# Patient Record
Sex: Female | Born: 1985 | Race: White | Hispanic: No | Marital: Single | Smoking: Current every day smoker
Health system: Southern US, Community
[De-identification: ages and names within clinical notes are randomized; demographics above are authoritative.]

## PROBLEM LIST (undated history)

## (undated) ENCOUNTER — Inpatient Hospital Stay (HOSPITAL_COMMUNITY): Payer: Self-pay

## (undated) DIAGNOSIS — F410 Panic disorder [episodic paroxysmal anxiety] without agoraphobia: Secondary | ICD-10-CM

## (undated) DIAGNOSIS — K649 Unspecified hemorrhoids: Secondary | ICD-10-CM

## (undated) DIAGNOSIS — J45909 Unspecified asthma, uncomplicated: Secondary | ICD-10-CM

## (undated) DIAGNOSIS — F419 Anxiety disorder, unspecified: Secondary | ICD-10-CM

## (undated) DIAGNOSIS — I10 Essential (primary) hypertension: Secondary | ICD-10-CM

## (undated) DIAGNOSIS — E785 Hyperlipidemia, unspecified: Secondary | ICD-10-CM

## (undated) DIAGNOSIS — R4020 Unspecified coma: Secondary | ICD-10-CM

## (undated) DIAGNOSIS — J40 Bronchitis, not specified as acute or chronic: Secondary | ICD-10-CM

## (undated) DIAGNOSIS — G4700 Insomnia, unspecified: Secondary | ICD-10-CM

## (undated) DIAGNOSIS — F909 Attention-deficit hyperactivity disorder, unspecified type: Secondary | ICD-10-CM

## (undated) DIAGNOSIS — G43909 Migraine, unspecified, not intractable, without status migrainosus: Secondary | ICD-10-CM

## (undated) HISTORY — DX: Bronchitis, not specified as acute or chronic: J40

## (undated) HISTORY — PX: HEMORRHOID SURGERY: SHX153

## (undated) HISTORY — DX: Unspecified coma: R40.20

## (undated) HISTORY — DX: Anxiety disorder, unspecified: F41.9

## (undated) HISTORY — DX: Unspecified hemorrhoids: K64.9

## (undated) HISTORY — DX: Unspecified asthma, uncomplicated: J45.909

## (undated) HISTORY — DX: Essential (primary) hypertension: I10

## (undated) HISTORY — DX: Insomnia, unspecified: G47.00

## (undated) HISTORY — DX: Attention-deficit hyperactivity disorder, unspecified type: F90.9

## (undated) HISTORY — DX: Hyperlipidemia, unspecified: E78.5

## (undated) HISTORY — DX: Panic disorder (episodic paroxysmal anxiety): F41.0

## (undated) HISTORY — DX: Migraine, unspecified, not intractable, without status migrainosus: G43.909

## (undated) HISTORY — PX: MOLE REMOVAL: SHX2046

---
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
## 2006-11-17 ENCOUNTER — Inpatient Hospital Stay (HOSPITAL_COMMUNITY): Admission: AD | Admit: 2006-11-17 | Discharge: 2006-11-21 | Payer: Self-pay | Admitting: Psychiatry

## 2006-11-17 ENCOUNTER — Ambulatory Visit: Payer: Self-pay | Admitting: Psychiatry

## 2007-02-21 ENCOUNTER — Emergency Department (HOSPITAL_COMMUNITY): Admission: EM | Admit: 2007-02-21 | Discharge: 2007-02-21 | Payer: Self-pay | Admitting: Emergency Medicine

## 2007-02-23 ENCOUNTER — Emergency Department (HOSPITAL_COMMUNITY): Admission: EM | Admit: 2007-02-23 | Discharge: 2007-02-23 | Payer: Self-pay | Admitting: Emergency Medicine

## 2007-02-26 ENCOUNTER — Emergency Department (HOSPITAL_COMMUNITY): Admission: EM | Admit: 2007-02-26 | Discharge: 2007-02-26 | Payer: Self-pay | Admitting: Emergency Medicine

## 2007-04-11 ENCOUNTER — Ambulatory Visit: Payer: Self-pay | Admitting: Family Medicine

## 2007-04-14 ENCOUNTER — Ambulatory Visit: Payer: Self-pay | Admitting: *Deleted

## 2007-07-04 ENCOUNTER — Ambulatory Visit: Payer: Self-pay | Admitting: Family Medicine

## 2007-08-04 ENCOUNTER — Ambulatory Visit: Payer: Self-pay | Admitting: Family Medicine

## 2007-08-19 ENCOUNTER — Ambulatory Visit: Payer: Self-pay | Admitting: Family Medicine

## 2007-08-19 ENCOUNTER — Encounter (INDEPENDENT_AMBULATORY_CARE_PROVIDER_SITE_OTHER): Payer: Self-pay | Admitting: Family Medicine

## 2007-08-19 LAB — CONVERTED CEMR LAB
Chlamydia, DNA Probe: POSITIVE — AB
GC Probe Amp, Genital: NEGATIVE

## 2007-12-25 ENCOUNTER — Ambulatory Visit: Payer: Self-pay | Admitting: Family Medicine

## 2008-02-17 ENCOUNTER — Ambulatory Visit: Payer: Self-pay | Admitting: Internal Medicine

## 2008-04-12 ENCOUNTER — Emergency Department (HOSPITAL_COMMUNITY): Admission: EM | Admit: 2008-04-12 | Discharge: 2008-04-12 | Payer: Self-pay | Admitting: Emergency Medicine
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
## 2011-07-26 LAB — URINALYSIS, ROUTINE W REFLEX MICROSCOPIC
Nitrite: NEGATIVE
Protein, ur: 30 — AB
Specific Gravity, Urine: 1.026
Urobilinogen, UA: 1

## 2011-07-26 LAB — POCT PREGNANCY, URINE
Operator id: 264031
Preg Test, Ur: NEGATIVE

## 2011-07-26 LAB — WET PREP, GENITAL
Trich, Wet Prep: NONE SEEN
Yeast Wet Prep HPF POC: NONE SEEN

## 2011-07-26 LAB — URINE MICROSCOPIC-ADD ON

## 2011-07-26 LAB — GC/CHLAMYDIA PROBE AMP, GENITAL
Chlamydia, DNA Probe: NEGATIVE
GC Probe Amp, Genital: NEGATIVE

## 2013-03-13 ENCOUNTER — Ambulatory Visit (INDEPENDENT_AMBULATORY_CARE_PROVIDER_SITE_OTHER): Payer: Medicaid Other | Admitting: Gastroenterology

## 2013-03-13 ENCOUNTER — Encounter (INDEPENDENT_AMBULATORY_CARE_PROVIDER_SITE_OTHER): Payer: Self-pay | Admitting: Surgery

## 2013-03-13 ENCOUNTER — Encounter: Payer: Self-pay | Admitting: Gastroenterology

## 2013-03-13 VITALS — BP 110/62 | HR 76 | Ht 62.6 in | Wt 118.4 lb

## 2013-03-13 DIAGNOSIS — K6289 Other specified diseases of anus and rectum: Secondary | ICD-10-CM

## 2013-03-13 DIAGNOSIS — K645 Perianal venous thrombosis: Secondary | ICD-10-CM

## 2013-03-13 MED ORDER — LIDOCAINE 5 % EX OINT: TOPICAL_OINTMENT | CUTANEOUS | Status: DC | PRN

## 2013-03-13 NOTE — Patient Instructions (Addendum)
You have been given a separate informational sheet regarding your tobacco use, the importance of quitting and local resources to help you quit.  You have been scheduled for an appointment at [facility]. Your appointment is on 03-13-13 at 3:30 pm. Please arrive at 3:15 pm for registration. Make certain to bring a list of current medications, including any over the counter medications or vitamins. Also bring your co-pay if you have one as well as your insurance cards. [facility] is located at [address], [address]. Should you need to reschedule your appointment, please contact them at [phone].  Please purchase Colace over the counter and take twice daily.  We have sent the following medications to your pharmacy for you to pick up at your convenience: Xylocaine, please take as directed.

## 2013-03-13 NOTE — Progress Notes (Signed)
History of Present Illness:  This is a 27 year old Caucasian female who has had problems with constipation and hemorrhoids for the last 2-1/2 years since the birth of her last child.  She apparently currently is on Anusol-HC cream and some type of suppository with continued pain in her rectum.  She denies abdominal pain, distention, nausea vomiting, or any systemic, upper GI or hepatobiliary problems.  Her appetite is good her weight is been stable.  I have reviewed this patient's present history, medical and surgical past history, allergies and medications.     ROS:   All systems were reviewed and are negative unless otherwise stated in the HPI.    Physical Exam: Blood pressure 110/82, pulse 76 and regular and weight 118. General well developed well nourished patient in no acute distress, appearing their stated age Eyes PERRLA, no icterus, fundoscopic exam per opthamologist Skin no lesions noted... multiple tattoos present Abdomen no hepatosplenomegaly masses or tenderness, BS normal.  Rectal there is no deep fissure or fistula, but the patient refused rectal exam.  There is a left posterior partially thrombosed external hemorrhoid which is very tender to touch.  Cannot appreciate any other lesions to external palpation of the perirectal area or inspection. Psychological mental status normal and normal affect.  Assessment and plan: Partially thrombosed left posterior hemorrhoid.  I placed her on Colace 100 milligrams twice a day, local Xylocaine cream to her rectum, and have referred her to surgery for consideration of incision of this painful external hemorrhoid.  I do not think she needs colonoscopy exam.  His CBC showed no evidence of anemia.  Her primary care physician appears to be [facility].  Please copy this note to this provider and also to [facility].  No diagnosis found.

## 2013-03-16 ENCOUNTER — Ambulatory Visit (INDEPENDENT_AMBULATORY_CARE_PROVIDER_SITE_OTHER): Payer: Medicaid Other | Admitting: General Surgery

## 2013-03-16 ENCOUNTER — Encounter (INDEPENDENT_AMBULATORY_CARE_PROVIDER_SITE_OTHER): Payer: Self-pay | Admitting: General Surgery

## 2013-03-16 VITALS — BP 120/82 | HR 64 | Temp 98.7°F | Resp 16 | Ht 62.0 in | Wt 120.0 lb

## 2013-03-16 DIAGNOSIS — K645 Perianal venous thrombosis: Secondary | ICD-10-CM

## 2013-03-16 NOTE — Patient Instructions (Signed)
We removed a small thrombosed hemorrhoid posteriorly.  Take a warm tub bath 3 times a day, and keep this area covered with dry gauze. It should heal completely within 10-14 days.  Take a stool softener twice a day and avoid constipation.  You will be given a prescription for some cream to put up inside the rectum 3 times a day.  Return to see [place] if further problems arise.    Hemorrhoids Hemorrhoids are enlarged (dilated) veins around the rectum. There are 2 types of hemorrhoids, and the type of hemorrhoid is determined by its location. Internal hemorrhoids occur in the veins just inside the rectum.They are usually not painful, but they may bleed.However, they may poke through to the outside and become irritated and painful. External hemorrhoids involve the veins outside the anus and can be felt as a painful swelling or hard lump near the anus.They are often itchy and may crack and bleed. Sometimes clots will form in the veins. This makes them swollen and painful. These are called thrombosed hemorrhoids. CAUSES Causes of hemorrhoids include:  Pregnancy. This increases the pressure in the hemorrhoidal veins.  Constipation.  Straining to have a bowel movement.  Obesity.  Heavy lifting or other activity that caused you to strain. TREATMENT Most of the time hemorrhoids improve in 1 to 2 weeks. However, if symptoms do not seem to be getting better or if you have a lot of rectal bleeding, your caregiver may perform a procedure to help make the hemorrhoids get smaller or remove them completely.Possible treatments include:  Rubber band ligation. A rubber band is placed at the base of the hemorrhoid to cut off the circulation.  Sclerotherapy. A chemical is injected to shrink the hemorrhoid.  Infrared light therapy. Tools are used to burn the hemorrhoid.  Hemorrhoidectomy. This is surgical removal of the hemorrhoid. HOME CARE INSTRUCTIONS   Increase fiber in your diet. Ask your  caregiver about using fiber supplements.  Drink enough water and fluids to keep your urine clear or pale yellow.  Exercise regularly.  Go to the bathroom when you have the urge to have a bowel movement. Do not wait.  Avoid straining to have bowel movements.  Keep the anal area dry and clean.  Only take over-the-counter or prescription medicines for pain, discomfort, or fever as directed by your caregiver. If your hemorrhoids are thrombosed:  Take warm sitz baths for 20 to 30 minutes, 3 to 4 times per day.  If the hemorrhoids are very tender and swollen, place ice packs on the area as tolerated. Using ice packs between sitz baths may be helpful. Fill a plastic bag with ice. Place a towel between the bag of ice and your skin.  Medicated creams and suppositories may be used or applied as directed.  Do not use a donut-shaped pillow or sit on the toilet for long periods. This increases blood pooling and pain. SEEK MEDICAL CARE IF:   You have increasing pain and swelling that is not controlled with your medicine.  You have uncontrolled bleeding.  You have difficulty or you are unable to have a bowel movement.  You have pain or inflammation outside the area of the hemorrhoids.  You have chills or an oral temperature above 102 F (38.9 C). MAKE SURE YOU:   Understand these instructions.  Will watch your condition.  Will get help right away if you are not doing well or get worse. Document Released: 10/12/2000 Document Revised: 01/07/2012 Document Reviewed: 09/25/2010 [employer] Patient Information 2013  [employer].

## 2013-03-16 NOTE — Progress Notes (Signed)
Patient ID: [name], female   DOB: [birth year], 27 y.o.   MRN: [id number] History: This patient was referred by Dr. Sheryn Bison for management of thrombosed external hemorrhoids. Patient states she's had problems with hemorrhoids for 2-1/2 years, since she had a vaginal delivery of her son. Recently she's had 3 episodes of bleeding and she also has pain in her rectum when she coughs.. She was to come to the office Friday but she canceled that appointment. She says she has bronchitis. She is an every day smoker. She was evaluated by Dr. Sheryn Bison last made last week and referred here.  ROS: 10 system review of systems is performed and is negative except as described above  Exam: Young woman. Mild distress. Coughing a lot..  Odor of tobacco. Rectal: Small thrombosed external hemorrhoid posteriorly. Digital rectal exam reveals mild tenderness but no trigger zones, no fissure. No blood. Did not do endoscopy. Procedure. Alcohol prep. 1% Xylocaine with epinephrine local. Conservatively to completely excise the thrombosed hemorrhoid posteriorly. Minimal bleeding. Wound dressed.  Assessment: Thrombosed external hemorrhoid, posterior. Excised today Possible internal hemorrhoids or mild proctitis. Daily tobacco use and abuse   Plan:smoking was discouraged Analpram-HC 2.5% cream 3 times a day. Prescription given Routine care following excision from his hemorrhoids with sitz baths, dry gauze. Stool softeners Return to see [place] if further problems arise.   Angelia Mould. Derrell Lolling, M.D., [facility] General and Minimally invasive Surgery Breast and Colorectal Surgery Office:   [phone] Pager:   [phone]

## 2013-03-19 ENCOUNTER — Telehealth (INDEPENDENT_AMBULATORY_CARE_PROVIDER_SITE_OTHER): Payer: Self-pay | Admitting: General Surgery
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
## 2013-03-20 ENCOUNTER — Telehealth (INDEPENDENT_AMBULATORY_CARE_PROVIDER_SITE_OTHER): Payer: Self-pay

## 2013-03-20 NOTE — Telephone Encounter (Signed)
Difficult to manage by phone. Offer urgent office visit for face to face evaluation if she desires.

## 2013-03-20 NOTE — Telephone Encounter (Signed)
V/M to call to schedule to  see Physician in urgent office  Today

## 2015-05-26 ENCOUNTER — Ambulatory Visit (INDEPENDENT_AMBULATORY_CARE_PROVIDER_SITE_OTHER): Payer: Medicaid Other | Admitting: Neurology

## 2015-05-26 ENCOUNTER — Encounter: Payer: Self-pay | Admitting: Neurology

## 2015-05-26 VITALS — BP 116/74 | HR 99 | Temp 98.3°F | Ht 63.0 in | Wt 107.2 lb

## 2015-05-26 DIAGNOSIS — G5600 Carpal tunnel syndrome, unspecified upper limb: Secondary | ICD-10-CM | POA: Diagnosis not present

## 2015-05-26 DIAGNOSIS — R5383 Other fatigue: Secondary | ICD-10-CM | POA: Insufficient documentation

## 2015-05-26 DIAGNOSIS — F410 Panic disorder [episodic paroxysmal anxiety] without agoraphobia: Secondary | ICD-10-CM | POA: Diagnosis not present

## 2015-05-26 DIAGNOSIS — F411 Generalized anxiety disorder: Secondary | ICD-10-CM

## 2015-05-26 DIAGNOSIS — M79641 Pain in right hand: Secondary | ICD-10-CM

## 2015-05-26 NOTE — Patient Instructions (Signed)
Remember to drink plenty of fluid, eat healthy meals and do not skip any meals. Try to eat protein with a every meal and eat a healthy snack such as fruit or nuts in between meals. Try to keep a regular sleep-wake schedule and try to exercise daily, particularly in the form of walking, 20-30 minutes a day, if you can.   As far as diagnostic testing: EMG/NCS  I would like to see you back for emg/ncs, sooner if we need to. Please call us with any interim questions, concerns, problems, updates or refill requests.   Our phone number is [phone]. We also have an after hours call service for urgent matters and there is a physician on-call for urgent questions. For any emergencies you know to call 911 or go to the nearest emergency room   
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
## 2015-06-05 ENCOUNTER — Emergency Department (HOSPITAL_COMMUNITY)
Admission: EM | Admit: 2015-06-05 | Discharge: 2015-06-05 | Disposition: A | Discharge status: Home / Self Care | Payer: Medicaid Other | Attending: Emergency Medicine | Admitting: Emergency Medicine

## 2015-06-05 ENCOUNTER — Emergency Department (HOSPITAL_COMMUNITY): Payer: Medicaid Other

## 2015-06-05 ENCOUNTER — Encounter (HOSPITAL_COMMUNITY): Payer: Self-pay | Admitting: Neurology

## 2015-06-05 DIAGNOSIS — F410 Panic disorder [episodic paroxysmal anxiety] without agoraphobia: Secondary | ICD-10-CM | POA: Diagnosis not present

## 2015-06-05 DIAGNOSIS — Z7951 Long term (current) use of inhaled steroids: Secondary | ICD-10-CM | POA: Diagnosis not present

## 2015-06-05 DIAGNOSIS — Z8639 Personal history of other endocrine, nutritional and metabolic disease: Secondary | ICD-10-CM | POA: Diagnosis not present

## 2015-06-05 DIAGNOSIS — R519 Headache, unspecified: Secondary | ICD-10-CM

## 2015-06-05 DIAGNOSIS — Z720 Tobacco use: Secondary | ICD-10-CM | POA: Diagnosis not present

## 2015-06-05 DIAGNOSIS — R51 Headache: Secondary | ICD-10-CM

## 2015-06-05 DIAGNOSIS — S6992XA Unspecified injury of left wrist, hand and finger(s), initial encounter: Secondary | ICD-10-CM | POA: Diagnosis not present

## 2015-06-05 DIAGNOSIS — J45909 Unspecified asthma, uncomplicated: Secondary | ICD-10-CM | POA: Insufficient documentation

## 2015-06-05 DIAGNOSIS — Z79899 Other long term (current) drug therapy: Secondary | ICD-10-CM | POA: Diagnosis not present

## 2015-06-05 DIAGNOSIS — Y999 Unspecified external cause status: Secondary | ICD-10-CM | POA: Diagnosis not present

## 2015-06-05 DIAGNOSIS — I10 Essential (primary) hypertension: Secondary | ICD-10-CM | POA: Diagnosis not present

## 2015-06-05 DIAGNOSIS — Z793 Long term (current) use of hormonal contraceptives: Secondary | ICD-10-CM | POA: Diagnosis not present

## 2015-06-05 DIAGNOSIS — Y9389 Activity, other specified: Secondary | ICD-10-CM | POA: Diagnosis not present

## 2015-06-05 DIAGNOSIS — S3992XA Unspecified injury of lower back, initial encounter: Secondary | ICD-10-CM | POA: Diagnosis not present

## 2015-06-05 DIAGNOSIS — S0990XA Unspecified injury of head, initial encounter: Secondary | ICD-10-CM | POA: Diagnosis not present

## 2015-06-05 DIAGNOSIS — F419 Anxiety disorder, unspecified: Secondary | ICD-10-CM | POA: Insufficient documentation

## 2015-06-05 DIAGNOSIS — Z8719 Personal history of other diseases of the digestive system: Secondary | ICD-10-CM | POA: Diagnosis not present

## 2015-06-05 DIAGNOSIS — G43909 Migraine, unspecified, not intractable, without status migrainosus: Secondary | ICD-10-CM | POA: Insufficient documentation

## 2015-06-05 DIAGNOSIS — G4700 Insomnia, unspecified: Secondary | ICD-10-CM | POA: Diagnosis not present

## 2015-06-05 DIAGNOSIS — S199XXA Unspecified injury of neck, initial encounter: Secondary | ICD-10-CM | POA: Insufficient documentation

## 2015-06-05 DIAGNOSIS — Y92410 Unspecified street and highway as the place of occurrence of the external cause: Secondary | ICD-10-CM | POA: Insufficient documentation

## 2015-06-05 DIAGNOSIS — M542 Cervicalgia: Secondary | ICD-10-CM

## 2015-06-05 MED ORDER — IBUPROFEN 200 MG PO TABS
600.0000 mg | ORAL_TABLET | Freq: Once | ORAL | Status: AC
  Administered 2015-06-05: 600 mg via ORAL
  Filled 2015-06-05: qty 3
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
## 2015-06-05 NOTE — Discharge Instructions (Signed)
Please monitor for new or worsening signs or symptoms, follow-up immediately if any present. Please use ibuprofen or Tylenol as needed for pain. Please follow-up with your primary care provider in 2-3 days for reevaluation.
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
## 2015-06-08 ENCOUNTER — Encounter: Payer: Medicaid Other | Admitting: Neurology

## 2015-06-28 ENCOUNTER — Telehealth: Payer: Self-pay | Admitting: *Deleted

## 2015-06-28 ENCOUNTER — Encounter: Payer: Medicaid Other | Admitting: Neurology

## 2015-06-28 NOTE — Telephone Encounter (Signed)
No showed EMG/NCS appt

## 2015-06-29 ENCOUNTER — Encounter: Payer: Self-pay | Admitting: Neurology

## 2015-06-30 ENCOUNTER — Emergency Department (INDEPENDENT_AMBULATORY_CARE_PROVIDER_SITE_OTHER)
Admission: EM | Admit: 2015-06-30 | Discharge: 2015-06-30 | Disposition: A | Discharge status: Home / Self Care | Payer: Medicaid Other | Source: Home / Self Care | Attending: Emergency Medicine | Admitting: Emergency Medicine

## 2015-06-30 ENCOUNTER — Encounter (HOSPITAL_COMMUNITY): Payer: Self-pay | Admitting: Emergency Medicine

## 2015-06-30 DIAGNOSIS — M654 Radial styloid tenosynovitis [de Quervain]: Secondary | ICD-10-CM | POA: Diagnosis not present

## 2015-06-30 DIAGNOSIS — G8929 Other chronic pain: Secondary | ICD-10-CM | POA: Diagnosis not present

## 2015-06-30 MED ORDER — OXYCODONE-ACETAMINOPHEN 5-325 MG PO TABS: 1.0000 | ORAL_TABLET | Freq: Three times a day (TID) | ORAL | Status: DC | PRN

## 2015-06-30 NOTE — Discharge Instructions (Signed)
I have given you a 1 month supply of your pain medicine. We will not be able to additional refills. I think some of your pain may be coming from [place] Quervain's syndrome.  Please wear the brace for the next 2 weeks to see if that helps.
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
## 2015-06-30 NOTE — ED Notes (Signed)
Stressed to patient the importance of not losing the prescription or the medication.  Reminded her that this clinic would not be able to refill chronic pain medications again.
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
## 2016-05-07 ENCOUNTER — Encounter (HOSPITAL_COMMUNITY): Payer: Self-pay | Admitting: Emergency Medicine

## 2016-05-07 ENCOUNTER — Ambulatory Visit (HOSPITAL_COMMUNITY)
Admission: EM | Admit: 2016-05-07 | Discharge: 2016-05-07 | Disposition: A | Discharge status: Home / Self Care | Payer: Medicaid Other | Attending: Emergency Medicine | Admitting: Emergency Medicine

## 2016-05-07 DIAGNOSIS — S0083XA Contusion of other part of head, initial encounter: Secondary | ICD-10-CM

## 2016-05-07 DIAGNOSIS — H9201 Otalgia, right ear: Secondary | ICD-10-CM

## 2016-05-07 DIAGNOSIS — G8929 Other chronic pain: Secondary | ICD-10-CM | POA: Diagnosis not present

## 2016-05-07 DIAGNOSIS — Z760 Encounter for issue of repeat prescription: Secondary | ICD-10-CM

## 2016-05-07 MED ORDER — VENLAFAXINE HCL 100 MG PO TABS: ORAL_TABLET | ORAL | Status: DC

## 2016-05-07 MED ORDER — MUPIROCIN CALCIUM 2 % EX CREA: 1.0000 "application " | TOPICAL_CREAM | Freq: Two times a day (BID) | CUTANEOUS | Status: DC

## 2016-05-07 NOTE — Discharge Instructions (Signed)
Chronic Pain  Chronic pain can be defined as pain that is off and on and lasts for 3-6 months or longer. Many things cause chronic pain, which can make it difficult to make a diagnosis. There are many treatment options available for chronic pain. However, finding a treatment that works well for you may require trying various approaches until the right one is found. Many people benefit from a combination of two or more types of treatment to control their pain.  SYMPTOMS   Chronic pain can occur anywhere in the body and can range from mild to very severe. Some types of chronic pain include:  · Headache.  · Low back pain.  · Cancer pain.  · Arthritis pain.  · Neurogenic pain. This is pain resulting from damage to nerves.   People with chronic pain may also have other symptoms such as:  · Depression.  · Anger.  · Insomnia.  · Anxiety.  DIAGNOSIS   Your health care provider will help diagnose your condition over time. In many cases, the initial focus will be on excluding possible conditions that could be causing the pain. Depending on your symptoms, your health care provider may order tests to diagnose your condition. Some of these tests may include:   · Blood tests.    · CT scan.    · MRI.    · X-rays.    · Ultrasounds.    · Nerve conduction studies.    You may need to see a specialist.   TREATMENT   Finding treatment that works well may take time. You may be referred to a pain specialist. He or she may prescribe medicine or therapies, such as:   · Mindful meditation or yoga.  · Shots (injections) of numbing or pain-relieving medicines into the spine or area of pain.  · Local electrical stimulation.  · Acupuncture.    · Massage therapy.    · Aroma, color, light, or sound therapy.    · Biofeedback.    · Working with a physical therapist to keep from getting stiff.    · Regular, gentle exercise.    · Cognitive or behavioral therapy.    · Group support.    Sometimes, surgery may be recommended.   HOME CARE INSTRUCTIONS    · Take all medicines as directed by your health care provider.    · Lessen stress in your life by relaxing and doing things such as listening to calming music.    · Exercise or be active as directed by your health care provider.    · Eat a healthy diet and include things such as vegetables, fruits, fish, and lean meats in your diet.    · Keep all follow-up appointments with your health care provider.    · Attend a support group with others suffering from chronic pain.  SEEK MEDICAL CARE IF:   · Your pain gets worse.    · You develop a new pain that was not there before.    · You cannot tolerate medicines given to you by your health care provider.    · You have new symptoms since your last visit with your health care provider.    SEEK IMMEDIATE MEDICAL CARE IF:   · You feel weak.    · You have decreased sensation or numbness.    · You lose control of bowel or bladder function.    · Your pain suddenly gets much worse.    · You develop shaking.  · You develop chills.  · You develop confusion.  · You develop chest pain.  · You develop shortness of breath.    MAKE SURE YOU:  ·   Document Revised: 06/17/2013 Document Reviewed: 04/10/2013 Elsevier Interactive Patient Education 2016 [employer] Inc[employer]  Facial or Scalp Contusion A facial or scalp contusion is a deep bruise on the face or head. Injuries to the face and head generally cause a lot of swelling, especially around the eyes. Contusions are the result of an injury that caused bleeding under the skin. The contusion may turn blue, purple, or yellow. Minor injuries will give you a painless contusion, but more severe contusions may stay  painful and swollen for a few weeks.  CAUSES  A facial or scalp contusion is caused by a blunt injury or trauma to the face or head area.  SIGNS AND SYMPTOMS   Swelling of the injured area.   Discoloration of the injured area.   Tenderness, soreness, or pain in the injured area.  DIAGNOSIS  The diagnosis can be made by taking a medical history and doing a physical exam. An X-ray exam, CT scan, or MRI may be needed to determine if there are any associated injuries, such as broken bones (fractures). TREATMENT  Often, the best treatment for a facial or scalp contusion is applying cold compresses to the injured area. Over-the-counter medicines may also be recommended for pain control.  HOME CARE INSTRUCTIONS   Only take over-the-counter or prescription medicines as directed by your health care provider.   Apply ice to the injured area.   Put ice in a plastic bag.   Place a towel between your skin and the bag.   Leave the ice on for 20 minutes, 2-3 times a day.  SEEK MEDICAL CARE IF:  You have bite problems.   You have pain with chewing.   You are concerned about facial defects. SEEK IMMEDIATE MEDICAL CARE IF:  You have severe pain or a headache that is not relieved by medicine.   You have unusual sleepiness, confusion, or personality changes.   You throw up (vomit).   You have a persistent nosebleed.   You have double vision or blurred vision.   You have fluid drainage from your nose or ear.   You have difficulty walking or using your arms or legs.  MAKE SURE YOU:   Understand these instructions.  Will watch your condition.  Will get help right away if you are not doing well or get worse.   This information is not intended to replace advice given to you by your health care provider. Make sure you discuss any questions you have with your health care provider.   Document Released: 11/22/2004 Document Revised: 11/05/2014 Document Reviewed:  05/28/2013 Elsevier Interactive Patient Education [employer] Inc.
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
## 2017-02-15 ENCOUNTER — Inpatient Hospital Stay (HOSPITAL_COMMUNITY)
Admission: AD | Admit: 2017-02-15 | Discharge: 2017-02-15 | Disposition: A | Discharge status: Home / Self Care | Payer: Medicaid Other | Source: Ambulatory Visit | Attending: Family Medicine | Admitting: Family Medicine

## 2017-02-15 ENCOUNTER — Encounter (HOSPITAL_COMMUNITY): Payer: Self-pay

## 2017-02-15 DIAGNOSIS — Z3A35 35 weeks gestation of pregnancy: Secondary | ICD-10-CM | POA: Diagnosis not present

## 2017-02-15 DIAGNOSIS — O163 Unspecified maternal hypertension, third trimester: Secondary | ICD-10-CM | POA: Diagnosis not present

## 2017-02-15 DIAGNOSIS — M25552 Pain in left hip: Secondary | ICD-10-CM | POA: Insufficient documentation

## 2017-02-15 DIAGNOSIS — O4703 False labor before 37 completed weeks of gestation, third trimester: Secondary | ICD-10-CM | POA: Insufficient documentation

## 2017-02-15 DIAGNOSIS — O26893 Other specified pregnancy related conditions, third trimester: Secondary | ICD-10-CM | POA: Insufficient documentation

## 2017-02-15 LAB — COMPREHENSIVE METABOLIC PANEL
ALT: 16 U/L (ref 14–54)
AST: 18 U/L (ref 15–41)
Albumin: 2.9 g/dL — ABNORMAL LOW (ref 3.5–5.0)
Alkaline Phosphatase: 196 U/L — ABNORMAL HIGH (ref 38–126)
Anion gap: 8 (ref 5–15)
BUN: 12 mg/dL (ref 6–20)
CO2: 22 mmol/L (ref 22–32)
CREATININE: 0.79 mg/dL (ref 0.44–1.00)
Calcium: 8.7 mg/dL — ABNORMAL LOW (ref 8.9–10.3)
Chloride: 101 mmol/L (ref 101–111)
GFR calc non Af Amer: >60 mL/min (ref >60)
Glucose, Bld: 70 mg/dL (ref 65–99)
POTASSIUM: 4 mmol/L (ref 3.5–5.1)
SODIUM: 131 mmol/L — AB (ref 135–145)
Total Bilirubin: 0.3 mg/dL (ref 0.3–1.2)
Total Protein: 7.1 g/dL (ref 6.5–8.1)

## 2017-02-15 LAB — URINALYSIS, ROUTINE W REFLEX MICROSCOPIC
Bilirubin Urine: NEGATIVE
GLUCOSE, UA: NEGATIVE mg/dL
HGB URINE DIPSTICK: NEGATIVE
KETONES UR: NEGATIVE mg/dL
Nitrite: NEGATIVE
PROTEIN: NEGATIVE mg/dL
Specific Gravity, Urine: 1.01 (ref 1.005–1.030)
pH: 6 (ref 5.0–8.0)

## 2017-02-15 LAB — CBC
HCT: 32.6 % — ABNORMAL LOW (ref 36.0–46.0)
Hemoglobin: 11 g/dL — ABNORMAL LOW (ref 12.0–15.0)
MCH: 31.2 pg (ref 26.0–34.0)
MCHC: 33.7 g/dL (ref 30.0–36.0)
MCV: 92.4 fL (ref 78.0–100.0)
Platelets: 364 10*3/uL (ref 150–400)
RBC: 3.53 MIL/uL — ABNORMAL LOW (ref 3.87–5.11)
RDW: 13.6 % (ref 11.5–15.5)
WBC: 14 10*3/uL — ABNORMAL HIGH (ref 4.0–10.5)

## 2017-02-15 LAB — PROTEIN / CREATININE RATIO, URINE
Creatinine, Urine: 72 mg/dL
Protein Creatinine Ratio: 0.15 mg/mg{Cre} (ref 0.00–0.15)
TOTAL PROTEIN, URINE: 11 mg/dL

## 2017-02-15 MED ORDER — ACETAMINOPHEN 325 MG PO TABS
650.0000 mg | ORAL_TABLET | Freq: Once | ORAL | Status: AC
  Administered 2017-02-15: 650 mg via ORAL
  Filled 2017-02-15: qty 2

## 2017-02-15 NOTE — MAU Provider Note (Signed)
CC:  Chief Complaint  Patient presents with  . Contractions  . Hip Pain     First Provider Initiated Contact with Patient 02/15/17 2026      HPI: [name] is a 31 y.o. year old [id number] female at [redacted]w[redacted]d weeks gestation who presents to [facility]U reporting contractions every 5-10 minutes for the past two hours, left hip and low back pain for several days and numerous other discomforts. Gets prenatal Care in [place]. Last cervical exam 1 cm. Denies Hx HTN.   Dx. BV 1 week ago., but has not taken Flagyl.  Associated Sx:  Vaginal bleeding: None Leaking of fluid: None Fetal movement: Nml Pos 2/10 HA. Hasn't needed or tried anything for pain. Denies vision changes or epigastric pain.   O: Patient Vitals for the past 24 hrs:  BP Temp Temp src Pulse Resp SpO2  02/15/17 2123 110/64 98 F (36.7 C) Oral 92 16 -  02/15/17 2052 121/82 - - 92 - -  02/15/17 2016 117/78 - - 89 - -  02/15/17 2003 119/82 - - 98 - -  02/15/17 1947 134/86 - - 89 - -  02/15/17 1946 (!) 129/91 - - 93 - -  02/15/17 1945 - - - (!) 102 - 100 %  02/15/17 1941 - - - 94 - 98 %  02/15/17 1936 (!) 145/86 98 F (36.7 C) Oral 99 18 100 %    General: NAD Heart: Regular rate Lungs: Normal rate and effort Abd: Soft, NT, Gravid, S=D Ext: No edema Neuro: DTRs 2+, no clonus, Nml gait Pelvic: NEFG, Neg LOF, Neg blood.  Dilation: 1 Effacement (%):  (long) Exam by:: Chancelor Hardrick cnm  EFM: 120, reactive Toco: Contractions every 4-5 minutes, mild  Results for orders placed or performed during the hospital encounter of 02/15/17 (from the past 24 hour(s))  Urinalysis, Routine w reflex microscopic     Status: Abnormal   Collection Time: 02/15/17  7:55 PM  Result Value Ref Range   Color, Urine YELLOW YELLOW   APPearance CLOUDY (A) CLEAR   Specific Gravity, Urine 1.010 1.005 - 1.030   pH 6.0 5.0 - 8.0   Glucose, UA NEGATIVE NEGATIVE mg/dL   Hgb urine dipstick NEGATIVE NEGATIVE   Bilirubin Urine NEGATIVE NEGATIVE   Ketones, ur NEGATIVE NEGATIVE mg/dL   Protein, ur NEGATIVE NEGATIVE mg/dL   Nitrite NEGATIVE NEGATIVE   Leukocytes, UA LARGE (A) NEGATIVE   RBC / HPF 6-30 0 - 5 RBC/hpf   WBC, UA 6-30 0 - 5 WBC/hpf   Bacteria, UA RARE (A) NONE SEEN   Squamous Epithelial / LPF 6-30 (A) NONE SEEN   Mucous PRESENT   Protein / creatinine ratio, urine     Status: None   Collection Time: 02/15/17  7:55 PM  Result Value Ref Range   Creatinine, Urine 72.00 mg/dL   Total Protein, Urine 11 mg/dL   Protein Creatinine Ratio 0.15 0.00 - 0.15 mg/mg[Cre]  CBC     Status: Abnormal   Collection Time: 02/15/17  8:36 PM  Result Value Ref Range   WBC 14.0 (H) 4.0 - 10.5 K/uL   RBC 3.53 (L) 3.87 - 5.11 MIL/uL   Hemoglobin 11.0 (L) 12.0 - 15.0 g/dL   HCT 16.1 (L) 09.6 - 04.5 %   MCV 92.4 78.0 - 100.0 fL   MCH 31.2 26.0 - 34.0 pg   MCHC 33.7 30.0 - 36.0 g/dL   RDW 40.9 81.1 - 91.4 %   Platelets 364 150 - 400  K/uL  Comprehensive metabolic panel     Status: Abnormal   Collection Time: 02/15/17  8:36 PM  Result Value Ref Range   Sodium 131 (L) 135 - 145 mmol/L   Potassium 4.0 3.5 - 5.1 mmol/L   Chloride 101 101 - 111 mmol/L   CO2 22 22 - 32 mmol/L   Glucose, Bld 70 65 - 99 mg/dL   BUN 12 6 - 20 mg/dL   Creatinine, Ser 1.61 0.44 - 1.00 mg/dL   Calcium 8.7 (L) 8.9 - 10.3 mg/dL   Total Protein 7.1 6.5 - 8.1 g/dL   Albumin 2.9 (L) 3.5 - 5.0 g/dL   AST 18 15 - 41 U/L   ALT 16 14 - 54 U/L   Alkaline Phosphatase 196 (H) 38 - 126 U/L   Total Bilirubin 0.3 0.3 - 1.2 mg/dL   GFR calc non Af Amer >60 >60 mL/min   GFR calc Af Amer >60 >60 mL/min   Anion gap 8 5 - 15   Orders Placed This Encounter  Procedures  . Urinalysis, Routine w reflex microscopic  . CBC  . Comprehensive metabolic panel  . Protein / creatinine ratio, urine  . Discharge patient   Meds ordered this encounter  Medications  . acetaminophen (TYLENOL) tablet 650 mg    A: [redacted]w[redacted]d week IUP Braxton Hicks FHR reactive HTN in pregnancy w/ out  evidence of Pre-E MS hip pain.   P: Discharge home in stable condition. Preterm labor precautions and fetal kick counts. Pre-E precautions.  Follow-up as scheduled for prenatal visit in [place] or sooner as needed if symptoms worsen. Return to maternity admissions as needed if symptoms worsen. Take Flagyl as directed for BV.   [place], CNM 10/22/2016 11:40 PM  3

## 2017-02-15 NOTE — MAU Note (Signed)
Left hip pain for 3-4 days, feels swollen and goes up into low back.  Contractions since 6:15 pm, every 5-10 min.  Was 1 cm at last appt in [facility].  Had a UTI and was supposed to pick up antibiotics Tuesday but hasn't yet due to financial reasons.  Having bad hemorhoids now. No bleeding. No leaking. Baby moving well.

## 2017-09-02 ENCOUNTER — Encounter (HOSPITAL_COMMUNITY): Payer: Self-pay

## 2018-05-24 ENCOUNTER — Encounter (HOSPITAL_COMMUNITY): Payer: Self-pay | Admitting: Emergency Medicine

## 2018-05-24 ENCOUNTER — Emergency Department (HOSPITAL_COMMUNITY)
Admission: EM | Admit: 2018-05-24 | Discharge: 2018-05-25 | Disposition: A | Discharge status: Home / Self Care | Payer: Medicaid Other | Attending: Emergency Medicine | Admitting: Emergency Medicine

## 2018-05-24 ENCOUNTER — Other Ambulatory Visit: Payer: Self-pay

## 2018-05-24 DIAGNOSIS — F17210 Nicotine dependence, cigarettes, uncomplicated: Secondary | ICD-10-CM | POA: Insufficient documentation

## 2018-05-24 DIAGNOSIS — J45909 Unspecified asthma, uncomplicated: Secondary | ICD-10-CM | POA: Insufficient documentation

## 2018-05-24 DIAGNOSIS — I10 Essential (primary) hypertension: Secondary | ICD-10-CM | POA: Insufficient documentation

## 2018-05-24 DIAGNOSIS — Z79899 Other long term (current) drug therapy: Secondary | ICD-10-CM | POA: Diagnosis not present

## 2018-05-24 DIAGNOSIS — R51 Headache: Secondary | ICD-10-CM | POA: Diagnosis not present

## 2018-05-24 DIAGNOSIS — R519 Headache, unspecified: Secondary | ICD-10-CM

## 2018-05-24 NOTE — ED Triage Notes (Signed)
Pt reports a migraine X4 days.  Went to her provider yesterday, got "a shot" but it came back.  Photo sensitive, states her vision is blurred.  States she is nausea and has not slept in four days.

## 2018-05-25 MED ORDER — KETOROLAC TROMETHAMINE 15 MG/ML IJ SOLN
15.0000 mg | Freq: Once | INTRAMUSCULAR | Status: AC
  Administered 2018-05-25: 15 mg via INTRAVENOUS
  Filled 2018-05-25: qty 1

## 2018-05-25 MED ORDER — DIPHENHYDRAMINE HCL 50 MG/ML IJ SOLN
25.0000 mg | Freq: Once | INTRAMUSCULAR | Status: AC
  Administered 2018-05-25: 25 mg via INTRAVENOUS
  Filled 2018-05-25: qty 1

## 2018-05-25 MED ORDER — DEXAMETHASONE SODIUM PHOSPHATE 10 MG/ML IJ SOLN
10.0000 mg | Freq: Once | INTRAMUSCULAR | Status: AC
  Administered 2018-05-25: 10 mg via INTRAVENOUS
  Filled 2018-05-25: qty 1

## 2018-05-25 MED ORDER — PROCHLORPERAZINE EDISYLATE 10 MG/2ML IJ SOLN
10.0000 mg | Freq: Once | INTRAMUSCULAR | Status: AC
  Administered 2018-05-25: 10 mg via INTRAVENOUS
  Filled 2018-05-25: qty 2

## 2018-05-25 NOTE — ED Notes (Signed)
Pt departed in NAD.  

## 2018-05-25 NOTE — ED Provider Notes (Signed)
[facility] Provider Note  CSN: [id number] Arrival date & time: 05/24/18 2157  Chief Complaint(s) Migraine  HPI [name] is a 32 y.o. female   The history is provided by the patient.  Migraine  This is a recurrent (bilateral temporal region) problem. Episode onset: 4 days. The problem occurs constantly. Progression since onset: fluctuating; gradual onset. Pertinent negatives include no chest pain, no abdominal pain and no shortness of breath. Exacerbated by: light, sounds, stress. The symptoms are relieved by medications. Treatments tried: got Toradol from PCP. The treatment provided moderate (but returned) relief.    Past Medical History Past Medical History:  Diagnosis Date  . ADHD (attention deficit hyperactivity disorder)   . Anxiety   . Asthma   . Bronchitis   . Hemorrhoids   . Hyperlipemia   . Hypertension   . Insomnia   . LOC (loss of consciousness) (HCC)   . Migraine   . Panic attack    Patient Active Problem List   Diagnosis Date Noted  . Generalized anxiety disorder 05/26/2015  . Fatigue 05/26/2015  . Panic attacks 05/26/2015  . Right hand pain 05/26/2015  . Thrombosed hemorrhoids 03/16/2013   Home Medication(s) Prior to Admission medications   Medication Sig Start Date End Date Taking? Authorizing Provider  albuterol (PROVENTIL HFA;VENTOLIN HFA) 108 (90 Base) MCG/ACT inhaler Inhale into the lungs.    [provider]  clonazePAM (KLONOPIN) 0.5 MG tablet 1 tab at bedtime 01/24/17   [provider]  hydrocortisone (ANUSOL-HC) 25 MG suppository Place 25 mg rectally. 01/10/17   [provider]  hydrOXYzine (VISTARIL) 25 MG capsule Take 25-50 mg by mouth 2 (two) times daily. Takes 25mg  in am and 50mg  in pm    [provider]  tiZANidine (ZANAFLEX) 2 MG tablet Take 2 mg by mouth. 01/10/17   [provider]  traZODone (DESYREL) 100 MG tablet Take 100 mg by mouth at bedtime.      [provider]  venlafaxine XR (EFFEXOR-XR) 150 MG 24 hr capsule Take 150 mg by mouth daily with breakfast.    [provider]                                                                                                                                    Past Surgical History Past Surgical History:  Procedure Laterality Date  . HEMORRHOID SURGERY     Removal  . MOLE REMOVAL     back   Family History Family History  Problem Relation Age of Onset  . Hypertension Father   . Hyperlipidemia Father   . Diabetes Paternal Grandfather   . Hypertension Paternal Grandfather   . Hyperlipidemia Paternal Grandfather   . Leukemia Maternal Grandmother   . Lung cancer Paternal Grandmother   . Lung cancer Maternal Grandfather     Social History Social History   Tobacco Use  . Smoking status: Current  Every Day Smoker    Packs/day: 0.50    Types: Cigarettes  . Smokeless tobacco: Never Used  Substance Use Topics  . Alcohol use: Yes    Alcohol/week: 0.0 oz    Comment: Rarely   . Drug use: No    Types: Marijuana    Comment: last marijuana was 02/14/2017   Allergies Codeine; Methocarbamol; Toradol [ketorolac tromethamine]; Tramadol; and Sumatriptan  Review of Systems Review of Systems  Respiratory: Negative for shortness of breath.   Cardiovascular: Negative for chest pain.  Gastrointestinal: Negative for abdominal pain.   All other systems are reviewed and are negative for acute change except as noted in the HPI  Physical Exam Vital Signs  I have reviewed the triage vital signs BP 120/81 (BP Location: Right Arm)   Pulse 76   Temp 98.3 F (36.8 C) (Oral)   Resp 14   SpO2 100%   Physical Exam  Constitutional: She is oriented to person, place, and time. She appears well-developed and well-nourished. No distress.  HENT:  Head: Normocephalic and atraumatic.  Right Ear: External ear normal.  Left Ear: External ear normal.  Nose: Nose normal.  Eyes:  Conjunctivae and EOM are normal. No scleral icterus.  Neck: Normal range of motion and phonation normal.  Cardiovascular: Normal rate and regular rhythm.  Pulmonary/Chest: Effort normal. No stridor. No respiratory distress.  Abdominal: She exhibits no distension.  Musculoskeletal: Normal range of motion. She exhibits no edema.  Neurological: She is alert and oriented to person, place, and time.  Mental Status:  Alert and oriented to person, place, and time.  Attention and concentration normal.  Speech clear.  Recent memory is intact  Cranial Nerves:  II Visual Fields: Intact to confrontation. Visual fields intact. III, IV, VI: Pupils equal and reactive to light and near. Full eye movement without nystagmus  V Facial Sensation: Normal. No weakness of masticatory muscles  VII: No facial weakness or asymmetry  VIII Auditory Acuity: Grossly normal  IX/X: The uvula is midline; the palate elevates symmetrically  XI: Normal sternocleidomastoid and trapezius strength  XII: The tongue is midline. No atrophy or fasciculations.   Motor System: Muscle Strength: 5/5 and symmetric in the upper and lower extremities. No pronation or drift.  Muscle Tone: Tone and muscle bulk are normal in the upper and lower extremities.   Reflexes: DTRs: 1+ and symmetrical in all four extremities. No Clonus Coordination: Intact finger-to-nose, heel-to-shin. No tremor.  Sensation: Intact to light touch, and pinprick. Negative Romberg test.  Gait: Routine gait normal.   Skin: She is not diaphoretic.  Psychiatric: She has a normal mood and affect. Her behavior is normal.  Vitals reviewed.   ED Results and Treatments Labs (all labs ordered are listed, but only abnormal results are displayed) Labs Reviewed - No data to display  EKG  EKG Interpretation  Date/Time:    Ventricular Rate:    PR  Interval:    QRS Duration:   QT Interval:    QTC Calculation:   R Axis:     Text Interpretation:        Radiology No results found. Pertinent labs & imaging results that were available during my care of the patient were reviewed by me and considered in my medical decision making (see chart for details).  Medications Ordered in ED Medications  diphenhydrAMINE (BENADRYL) injection 25 mg (25 mg Intravenous Given 05/25/18 0202)  dexamethasone (DECADRON) injection 10 mg (10 mg Intravenous Given 05/25/18 0158)  prochlorperazine (COMPAZINE) injection 10 mg (10 mg Intravenous Given 05/25/18 0156)  ketorolac (TORADOL) 15 MG/ML injection 15 mg (15 mg Intravenous Given 05/25/18 0153)                                                                                                                                    Procedures Procedures  (including critical care time)  Medical Decision Making / ED Course I have reviewed the nursing notes for this encounter and the patient's prior records (if available in EHR or on provided paperwork).    Typical headache for the pt. Non focal neuro exam. No recent head trauma. No fever. Doubt meningitis. Doubt intracranial bleed. Doubt IIH. No indication for imaging. Will treat with migraine cocktail.   Improved headache.  The patient appears reasonably screened and/or stabilized for discharge and I doubt any other medical condition or other [facility] requiring further screening, evaluation, or treatment in the ED at this time prior to discharge.  The patient is safe for discharge with strict return precautions.    Final Clinical Impression(s) / ED Diagnoses Final diagnoses:  Bad headache    Disposition: Discharge  Condition: Good  I have discussed the results, Dx and Tx plan with the patient who expressed understanding and agree(s) with the plan. Discharge instructions discussed at great length. The patient was given strict return precautions who verbalized  understanding of the instructions. No further questions at time of discharge.    ED Discharge Orders    None       Follow Up: Caroline More, PA-C [id number] [place] [phone]  Schedule an appointment as soon as possible for a visit  As needed     This chart was dictated using voice recognition software.  Despite best efforts to proofread,  errors can occur which can change the documentation meaning.   Nira Conn, MD 05/25/18 [phone]

## 2018-05-25 NOTE — Discharge Instructions (Addendum)
You may use over-the-counter Motrin (Ibuprofen), Acetaminophen (Tylenol), topical muscle creams such as SalonPas, Icy Hot, Bengay, etc. Please stretch, apply heat, and have massage therapy for additional assistance. ° °

## 2022-05-27 ENCOUNTER — Encounter (HOSPITAL_COMMUNITY): Payer: Self-pay

## 2022-05-27 ENCOUNTER — Emergency Department (HOSPITAL_COMMUNITY)
Admission: EM | Admit: 2022-05-27 | Discharge: 2022-05-27 | Disposition: A | Discharge status: Home / Self Care | Payer: Medicaid Other | Attending: Emergency Medicine | Admitting: Emergency Medicine

## 2022-05-27 ENCOUNTER — Other Ambulatory Visit: Payer: Self-pay

## 2022-05-27 DIAGNOSIS — F1590 Other stimulant use, unspecified, uncomplicated: Secondary | ICD-10-CM | POA: Diagnosis present

## 2022-05-27 DIAGNOSIS — F1520 Other stimulant dependence, uncomplicated: Secondary | ICD-10-CM | POA: Insufficient documentation

## 2022-05-27 DIAGNOSIS — Z748 Other problems related to care provider dependency: Secondary | ICD-10-CM | POA: Diagnosis not present

## 2022-05-27 DIAGNOSIS — Z789 Other specified health status: Secondary | ICD-10-CM

## 2022-05-27 NOTE — Discharge Instructions (Addendum)
You were seen in the emergency department requesting detox from methamphetamines.  As we discussed, our facility does not do detox but I've attached a long list of options in the area.   Continue to monitor how you're doing and return to the ER for new or worsening symptoms.

## 2022-05-27 NOTE — ED Triage Notes (Signed)
Patient reports that she wants to detox from meth. Patient states she last used today.

## 2022-05-27 NOTE — ED Triage Notes (Signed)
36 yo female presents today stating that she wants to detox from methamphetamines. Pt states Her last use was earlier today. No other complaints at this time

## 2022-05-27 NOTE — ED Provider Notes (Signed)
[facility]-EMERGENCY DEPT Provider Note   CSN: [id number] Arrival date & time: 05/27/22  1545     History  No chief complaint on file.   [name] is a 36 y.o. female who presents the emergency department requesting detox from methamphetamines.  She states that her child's father told her that our hospital has detox capabilities.  She states her last use was earlier today.  She also complaining of hunger, not sure the last time she ate.  No other complaints at this time.  HPI     Home Medications Prior to Admission medications   Medication Sig Start Date End Date Taking? Authorizing Provider  albuterol (PROVENTIL HFA;VENTOLIN HFA) 108 (90 Base) MCG/ACT inhaler Inhale into the lungs.    [provider]  clonazePAM (KLONOPIN) 0.5 MG tablet 1 tab at bedtime 01/24/17   [provider]  hydrocortisone (ANUSOL-HC) 25 MG suppository Place 25 mg rectally. 01/10/17   [provider]  hydrOXYzine (VISTARIL) 25 MG capsule Take 25-50 mg by mouth 2 (two) times daily. Takes 25mg  in am and 50mg  in pm    [provider]  tiZANidine (ZANAFLEX) 2 MG tablet Take 2 mg by mouth. 01/10/17   [provider]  traZODone (DESYREL) 100 MG tablet Take 100 mg by mouth at bedtime.     [provider]  venlafaxine XR (EFFEXOR-XR) 150 MG 24 hr capsule Take 150 mg by mouth daily with breakfast.    [provider]      Allergies    Codeine, Methocarbamol, Toradol [ketorolac tromethamine], Tramadol, and Sumatriptan    Review of Systems   Review of Systems  All other systems reviewed and are negative.   Physical Exam Updated Vital Signs BP 120/74 (BP Location: Left Arm)   Pulse 80   Temp 98.2 F (36.8 C) (Oral)   Resp 16   Ht 5\' 3"  (1.6 m)   Wt 51.4 kg   LMP 04/27/2022 (Approximate)   SpO2 100%   BMI 20.07 kg/m  Physical Exam Vitals and nursing note reviewed.  Constitutional:      Appearance: Normal  appearance.  HENT:     Head: Normocephalic and atraumatic.  Eyes:     Conjunctiva/sclera: Conjunctivae normal.  Pulmonary:     Effort: Pulmonary effort is normal. No respiratory distress.  Skin:    General: Skin is warm and dry.  Neurological:     Mental Status: She is alert.  Psychiatric:        Mood and Affect: Mood normal.        Behavior: Behavior normal.     ED Results / Procedures / Treatments   Labs (all labs ordered are listed, but only abnormal results are displayed) Labs Reviewed - No data to display  EKG None  Radiology No results found.  Procedures Procedures    Medications Ordered in ED Medications - No data to display  ED Course/ Medical Decision Making/ A&P                           Medical Decision Making  Patient is a 36 year old female presents to the emergency department requesting detox from methamphetamines.  Last used today.  On exam patient has normal vital signs, clinically appears well.  Discussed that our facility does not have a detox program, but would be happy to provide her with resources.    She has no other complaints at this time and is stable for  discharge home.  Given something to eat, substance abuse resources and transportation assistance.  Patient discharged in stable condition.  We discussed reasons to return to the emergency department, and she is agreeable to the plan.        Final Clinical Impression(s) / ED Diagnoses Final diagnoses:  Methamphetamine use disorder, severe, dependence (HCC)  Need for community resource    Rx / DC Orders ED Discharge Orders     None      Portions of this report may have been transcribed using voice recognition software. Every effort was made to ensure accuracy; however, inadvertent computerized transcription errors may be present.    Jeanella Flattery 05/27/22 2157    Pricilla Loveless, MD 05/28/22 [phone]
